# Patient Record
Sex: Male | Born: 1960 | Race: White | Hispanic: No | Marital: Married | Smoking: Never smoker
Health system: Southern US, Community
[De-identification: ages and names within clinical notes are randomized; demographics above are authoritative.]

---

## 1987-12-17 HISTORY — PX: WRIST FRACTURE SURGERY: SHX121

## 2005-11-27 ENCOUNTER — Ambulatory Visit: Payer: Self-pay | Admitting: Family Medicine

## 2006-07-14 ENCOUNTER — Ambulatory Visit: Payer: Self-pay | Admitting: Family Medicine

## 2007-08-18 ENCOUNTER — Encounter: Payer: Self-pay | Admitting: Family Medicine

## 2007-08-19 ENCOUNTER — Ambulatory Visit: Payer: Self-pay | Admitting: Family Medicine

## 2007-08-19 DIAGNOSIS — R42 Dizziness and giddiness: Secondary | ICD-10-CM

## 2007-08-19 LAB — CONVERTED CEMR LAB
Bilirubin Urine: NEGATIVE
Blood in Urine, dipstick: NEGATIVE
Ketones, urine, test strip: NEGATIVE
Protein, U semiquant: NEGATIVE
Urobilinogen, UA: NEGATIVE

## 2007-08-21 ENCOUNTER — Telehealth: Payer: Self-pay | Admitting: Family Medicine

## 2007-09-17 ENCOUNTER — Ambulatory Visit: Payer: Self-pay | Admitting: Family Medicine

## 2007-09-24 ENCOUNTER — Encounter: Payer: Self-pay | Admitting: Family Medicine

## 2007-09-25 ENCOUNTER — Encounter: Payer: Self-pay | Admitting: Family Medicine

## 2007-09-25 LAB — CONVERTED CEMR LAB
Albumin: 4.2 g/dL (ref 3.5–5.2)
Alkaline Phosphatase: 76 units/L (ref 39–117)
BUN: 16 mg/dL (ref 6–23)
Cholesterol: 216 mg/dL — ABNORMAL HIGH (ref 0–200)
Eosinophils Absolute: 0.3 10*3/uL (ref 0.0–0.7)
Eosinophils Relative: 5 % (ref 0–5)
Glucose, Bld: 92 mg/dL (ref 70–99)
HCT: 45.8 % (ref 39.0–52.0)
HDL: 56 mg/dL (ref >39)
Hemoglobin: 15.3 g/dL (ref 13.0–17.0)
LDL Cholesterol: 143 mg/dL — ABNORMAL HIGH (ref 0–99)
Lymphs Abs: 1.7 10*3/uL (ref 0.7–3.3)
MCV: 90 fL (ref 78.0–100.0)
Monocytes Relative: 8 % (ref 3–11)
RBC: 5.09 M/uL (ref 4.22–5.81)
Total Bilirubin: 0.6 mg/dL (ref 0.3–1.2)
Triglycerides: 83 mg/dL (ref <150)
VLDL: 17 mg/dL (ref 0–40)
WBC: 5.8 10*3/uL (ref 4.0–10.5)

## 2008-02-09 ENCOUNTER — Telehealth: Payer: Self-pay | Admitting: Family Medicine

## 2008-10-21 ENCOUNTER — Ambulatory Visit: Payer: Self-pay | Admitting: Family Medicine

## 2008-10-21 DIAGNOSIS — S239XXA Sprain of unspecified parts of thorax, initial encounter: Secondary | ICD-10-CM

## 2008-10-21 DIAGNOSIS — S139XXA Sprain of joints and ligaments of unspecified parts of neck, initial encounter: Secondary | ICD-10-CM

## 2011-01-17 ENCOUNTER — Encounter: Payer: Self-pay | Admitting: Emergency Medicine

## 2011-01-17 ENCOUNTER — Ambulatory Visit: Payer: Self-pay | Admitting: Emergency Medicine

## 2011-01-17 ENCOUNTER — Ambulatory Visit (INDEPENDENT_AMBULATORY_CARE_PROVIDER_SITE_OTHER): Payer: Commercial Managed Care - PPO | Admitting: Emergency Medicine

## 2011-01-17 DIAGNOSIS — L255 Unspecified contact dermatitis due to plants, except food: Secondary | ICD-10-CM | POA: Insufficient documentation

## 2011-01-23 NOTE — Assessment & Plan Note (Signed)
Summary: POISON IVY/TJ   Vital Signs:  Patient profile:   50 year old male Height:      74 inches Weight:      202 pounds BMI:     26.03 O2 Sat:      98 % on Room air Temp:     98.3 degrees F oral Pulse rate:   84 / minute Resp:     16 per minute BP sitting:   117 / 78  (left arm) Cuff size:   large  Vitals Entered By: Lajean Saver RN (January 17, 2011 2:19 PM)  O2 Flow:  Room air History of Present Illness History from: patient Chief Complaint: poison ivy to both hands History of Present Illness: Got into some poison ivy last weekend while doing yard work.  He has tried Rx for Triamcinolone and Atarax which aren't helping much.  Itchy rash to both back of hands.  No other sites are affected.  He is asking for a steroid shot.  No F/C.   CC: poison ivy to both hands Is Patient Diabetic? No Pain Assessment Patient in pain? no        Allergies (verified): No Known Drug Allergies  Past History:  Past Medical History: Reviewed history from 09/23/2006 and no changes required. _  Past Surgical History: Reviewed history from 09/23/2006 and no changes required. Left wrist sx   1989  Family History: Reviewed history from 09/17/2007 and no changes required. DM-GM, GF High chol-Father, brother, HTN-Father,  MI-Father age 63,  Stroke-GF GF died from Brain Tumor, Aunt with Brain Tumor  Social History: Reviewed history from 09/23/2006 and no changes required. Art gallery manager at [employer], Event organiser.  Married to Clydie Braun and 3 children at home.  Never smoked, no EtOH, no drugs, 3-4 caffeinated drinks per week.  Works out 1 hour 3-4 days per week.  Physical Exam  General:  Well-developed,well-nourished,in no acute distress; alert,appropriate and cooperative throughout examination Mouth:  Oral mucosa and oropharynx without lesions or exudates.  Teeth in good repair. Lungs:  Normal respiratory effort, chest expands symmetrically. Lungs are clear to auscultation, no crackles or  wheezes. Heart:  Normal rate and regular rhythm. S1 and S2 normal without gallop, murmur, click, rub or other extra sounds. Skin:  Dorsal aspect both hands and wrists with erythematous irritated raises lesions c/w contact dermatitis. Psych:  Cognition and judgment appear intact. Alert and cooperative with normal attention span and concentration. No apparent delusions, illusions, hallucinations   Impression & Recommendations:  Problem # 1:  POISON IVY DERMATITIS (ICD-692.6) Continue Atarax and Triamcinolone cream as needed.  Avoid scratching.  Cooler showers.  Take meds as directed.  Follow-up with your primary care physician if not improving or if getting worse.  May take a few more days to start getting better.  His updated medication list for this problem includes:    Prednisone 10 Mg Tabs (Prednisone) ..... 20mg  two times a day for 4 days, no taper  Orders: Solumedrol up to 125mg  (W1191) Admin of Therapeutic Inj  intramuscular or subcutaneous (47829)  Complete Medication List: 1)  Calcium 500 + D 500-125 Mg-unit Tabs (Calcium carbonate-vitamin d) .... Take one tablet by mouth once a day 2)  Multivitamins Tabs (Multiple vitamin) .... Take one tablet by mouth once aday 3)  Niacin 500 Mg Tabs (Niacin) .... Take one tablet by mouth once aday 4)  Co Q-10 Vitamin E Fish Oil 60-90-25-200 Caps (Dha-epa-coenzyme q10-vitamin e) 5)  Tamiflu 75 Mg Caps (Oseltamivir phosphate) .Marland KitchenMarland KitchenMarland Kitchen  Take 1 tablet by mouth once a day for 10 days 6)  Diclofenac Sodium 75 Mg Tbec (Diclofenac sodium) .... Take 1 tablet by mouth two times a day for muscle pain 7)  Flexeril 10 Mg Tabs (Cyclobenzaprine hcl) .... Take 1 tablet by mouth once a day at bedtime as needed muscle spasm 8)  Skelaxin 800 Mg Tabs (Metaxalone) .... Take 1 tablet by mouth three times a day as needed muscle spasm 9)  Prednisone 10 Mg Tabs (Prednisone) .... 20mg  two times a day for 4 days, no taper Prescriptions: PREDNISONE 10 MG TABS (PREDNISONE) 20mg   two times a day for 4 days, no taper  #QS x 0   Entered and Authorized by:   Hoyt Koch MD   Signed by:   Hoyt Koch MD on 01/17/2011   Method used:   Print then Give to Patient   RxID:   [id number]    Medication Administration  Injection # 1:    Medication: Solumedrol up to 125mg     Diagnosis: POISON IVY DERMATITIS (ICD-692.6)    Route: IM    Site: LUOQ gluteus    Exp Date: 05/17/2013    Lot #: [id number]    Mfr: Pharmacia    Patient tolerated injection without complications    Given by: Clemens Catholic LPN (January 17, 2011 2:40 PM)  Orders Added: 1)  New Patient Level III [99203] 2)  Solumedrol up to 125mg  [J2930] 3)  Admin of Therapeutic Inj  intramuscular or subcutaneous [14782]

## 2011-02-27 ENCOUNTER — Ambulatory Visit
Admission: RE | Admit: 2011-02-27 | Discharge: 2011-02-27 | Disposition: A | Discharge status: Home / Self Care | Payer: Commercial Managed Care - PPO | Source: Ambulatory Visit | Attending: Family Medicine | Admitting: Family Medicine

## 2011-02-27 ENCOUNTER — Ambulatory Visit (INDEPENDENT_AMBULATORY_CARE_PROVIDER_SITE_OTHER): Payer: BC Managed Care – PPO | Admitting: Family Medicine

## 2011-02-27 ENCOUNTER — Other Ambulatory Visit: Payer: Self-pay | Admitting: Family Medicine

## 2011-02-27 ENCOUNTER — Encounter: Payer: Self-pay | Admitting: Family Medicine

## 2011-02-27 DIAGNOSIS — N508 Other specified disorders of male genital organs: Secondary | ICD-10-CM | POA: Insufficient documentation

## 2011-02-27 DIAGNOSIS — N5089 Other specified disorders of the male genital organs: Secondary | ICD-10-CM

## 2011-02-27 LAB — CONVERTED CEMR LAB
Bilirubin Urine: NEGATIVE
Glucose, Urine, Semiquant: NEGATIVE
Ketones, urine, test strip: NEGATIVE
WBC Urine, dipstick: NEGATIVE
pH: 5.5

## 2011-02-28 ENCOUNTER — Encounter: Payer: Self-pay | Admitting: Family Medicine

## 2011-03-05 NOTE — Assessment & Plan Note (Signed)
Summary: Followup Call  Followup call to Patient:  He reports that his discomfort has decreased.  Has not been taking NSAID.  No dysuria. Recommended that he continue Ibuprofen regularly for several days. Recommend urology evaluation if not improving. Donna Christen MD  February 28, 2011 1:32 PM

## 2011-03-05 NOTE — Assessment & Plan Note (Signed)
Summary: KNOT ON TESTICLE ? (rm 5)   Vital Signs:  Patient Profile:   50 Years Old Male CC:      knot on testicle, discomfort Height:     74 inches (157.48 cm) Weight:      200 pounds O2 Sat:      98 % O2 treatment:    Room Air Temp:     98.8 degrees F oral Pulse rate:   72 / minute Resp:     16 per minute BP sitting:   132 / 88  (left arm) Cuff size:   large  Vitals Entered By: Lajean Saver RN (February 27, 2011 8:28 AM)                  Updated Prior Medication List: CALCIUM 500 + D 500-125 MG-UNIT  TABS (CALCIUM CARBONATE-VITAMIN D) Take one tablet by mouth once a day MULTIVITAMINS   TABS (MULTIPLE VITAMIN) Take one tablet by mouth once aday NIACIN 500 MG  TABS (NIACIN) Take one tablet by mouth once aday CO Q-10 VITAMIN E FISH OIL 60-90-25-200  CAPS (DHA-EPA-COENZYME Q10-VITAMIN E)   Current Allergies: No known allergies History of Present Illness Chief Complaint: knot on testicle, discomfort History of Present Illness:  Subjective:  Patient complains of noticing some vague discomfort in his right scrotum about a week ago.  This morning he noticed discomfort again, and upon palpating his right scrotum he found a tender swollen area in right upper scrotum.  No dysuria.  No fevers, chills, and sweat.  No abdominal pain.  He feels well otherwise. He had an episode of right testicular pain about 4 years ago and was ultimately treated for epididymitis by a urologist with resolution of symptoms   REVIEW OF SYSTEMS Constitutional Symptoms      Denies fever, chills, night sweats, weight loss, weight gain, and fatigue.  Eyes       Denies change in vision, eye pain, eye discharge, glasses, contact lenses, and eye surgery. Ear/Nose/Throat/Mouth       Denies hearing loss/aids, change in hearing, ear pain, ear discharge, dizziness, frequent runny nose, frequent nose bleeds, sinus problems, sore throat, hoarseness, and tooth pain or bleeding.  Respiratory       Denies dry cough,  productive cough, wheezing, shortness of breath, asthma, bronchitis, and emphysema/COPD.  Cardiovascular       Denies murmurs, chest pain, and tires easily with exhertion.    Gastrointestinal       Denies stomach pain, nausea/vomiting, diarrhea, constipation, blood in bowel movements, and indigestion. Genitourniary       Denies painful urination, kidney stones, and loss of urinary control. Neurological       Denies paralysis, seizures, and fainting/blackouts. Musculoskeletal       Denies muscle pain, joint pain, joint stiffness, decreased range of motion, redness, swelling, muscle weakness, and gout.  Skin       Denies bruising, unusual mles/lumps or sores, and hair/skin or nail changes.  Psych       Denies mood changes, temper/anger issues, anxiety/stress, speech problems, depression, and sleep problems. Other Comments: Patient has had some testicular discomfort, mild, x 1 week. This AM he noticed a knot underneath the skin about the size of an M&M. Denies any urianry changes. No hx of prostate problems   Past History:  Past Medical History: _ Unremarkable  Past Surgical History: Reviewed history from 09/23/2006 and no changes required. Left wrist sx   1989  Family History: Reviewed history from 09/17/2007  and no changes required. DM-GM, GF High chol-Father, brother, HTN-Father,  MI-Father age 72,  Stroke-GF GF died from Brain Tumor, Aunt with Brain Tumor  Social History: Reviewed history from 09/23/2006 and no changes required. Art gallery manager at [employer], Event organiser.  Married to Clydie Braun and 3 children at home.  Never smoked, no EtOH, no drugs, 3-4 caffeinated drinks per week.  Works out 1 hour 3-4 days per week.   Objective:  Appearance:  Patient appears healthy, stated age, and in no acute distress  Abdomen:  Nontender without masses or hepatosplenomegaly.  Bowel sounds are present.  No CVA or flank tenderness.  Genitourinary:  Penis normal without lesions or urethral  discharge.  Scrotum is normal without erythema or warmth.  Testes are descended bilaterally.  Left testicle and epididymitis normal without masses.  On superior/anterior pole of right testicle is a cystic mass that is tender to palpation.  Unable to adequately palpate right testicle because of patient's discomfort.  No hernias are palpated.  No regional lymphadenopathy palpated.  urinalysis (dipstick): negative  Ultrasound Scrotum:   IMPRESSION:  1.  The testicles are normal. 2.  There is a 4.3 mm cyst in the right epididymal head that seems to correlate clinically with the area of concern on physical exam. 3.  There is also a left epididymal cyst. 4.  There are bilateral hydroceles. 5.  There are bilateral varicoceles. Assessment New Problems: EPIDIDYMAL CYST (ICD-608.89)   Plan New Orders: Ultrasound [Ultrasound] T-Urinalysis Dipstick only [81003QW] Est. Patient Level III [04540] Planning Comments:   Later:  Discussed ultrasound results with patient.  He notes that his discomfort is minimal after taking Ibuprofen. Recommend that he continue Ibuprofen 200mg , 4 tabs every 8 hours with food for several days until improved. Recommend follow-up by urologist (may call for referral)   The patient and/or caregiver has been counseled thoroughly with regard to medications prescribed including dosage, schedule, interactions, rationale for use, and possible side effects and they verbalize understanding.  Diagnoses and expected course of recovery discussed and will return if not improved as expected or if the condition worsens. Patient and/or caregiver verbalized understanding.   Orders Added: 1)  Ultrasound [Ultrasound] 2)  T-Urinalysis Dipstick only [81003QW] 3)  Est. Patient Level III [98119]    Laboratory Results   Urine Tests  Date/Time Received: February 27, 2011 8:47 AM  Date/Time Reported: February 27, 2011 8:47 AM   Routine Urinalysis   Color: yellow Appearance:  Clear Glucose: negative   (Normal Range: Negative) Bilirubin: negative   (Normal Range: Negative) Ketone: negative   (Normal Range: Negative) Spec. Gravity: 1.025   (Normal Range: 1.003-1.035) Blood: negative   (Normal Range: Negative) pH: 5.5   (Normal Range: 5.0-8.0) Protein: negative   (Normal Range: Negative) Urobilinogen: 0.2   (Normal Range: 0-1) Nitrite: negative   (Normal Range: Negative) Leukocyte Esterace: negative   (Normal Range: Negative)

## 2011-05-23 ENCOUNTER — Encounter: Payer: Self-pay | Admitting: Family Medicine

## 2011-05-24 ENCOUNTER — Ambulatory Visit (INDEPENDENT_AMBULATORY_CARE_PROVIDER_SITE_OTHER): Payer: BC Managed Care – PPO | Admitting: Family Medicine

## 2011-05-24 ENCOUNTER — Encounter: Payer: Self-pay | Admitting: Family Medicine

## 2011-05-24 VITALS — BP 127/74 | HR 74 | Resp 20 | Ht 72.0 in | Wt 209.0 lb

## 2011-05-24 DIAGNOSIS — Z23 Encounter for immunization: Secondary | ICD-10-CM

## 2011-05-24 DIAGNOSIS — Z0000 Encounter for general adult medical examination without abnormal findings: Secondary | ICD-10-CM

## 2011-05-24 MED ORDER — TETANUS-DIPHTH-ACELL PERTUSSIS 5-2.5-18.5 LF-MCG/0.5 IM SUSP: 0.5000 mL | Freq: Once | INTRAMUSCULAR | Status: DC

## 2011-05-24 NOTE — Progress Notes (Signed)
Subjective:    Patient ID: Jimmy Hoffman, male    DOB: 1961/04/20, 50 y.o.   MRN: [id number]  HPI Here for CPE today.  REviewed his history as below and information updated. He has nos specific complaints today.  Works out some.     Review of Systems  BP 127/74  Pulse 74  Resp 20  Ht 6' (1.829 m)  Wt 209 lb (94.802 kg)  BMI 28.35 kg/m2  SpO2 98%    No Known Allergies  No past medical history on file.  Past Surgical History  Procedure Date  . Wrist fracture surgery 1989    Left     History   Social History  . Marital Status: Married    Spouse Name: N/A    Number of Children: 3  . Years of Education: Masters   Occupational History  . 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    Social History Main Topics  . Smoking status: Never Smoker   . Smokeless tobacco: Not on file  . Alcohol Use: No  . Drug Use: No  . Sexually Active: Not on file     , 3 children,3-4 caffeine drinks per week, works out 3-4 fays per week.   Other Topics Concern  . Not on file   Social History Narrative   Working out regularly.  Occ caffeine.      Family History  Problem Relation Age of Onset  . Diabetes Maternal Grandmother   . Hyperlipidemia Father   . Hypertension Father   . Hyperlipidemia Brother   . Heart attack Father 48  . Stroke Maternal Grandfather   . Brain cancer Paternal Grandfather     Brain tumor  . Brain cancer Paternal Aunt     brain tumor  . Diabetes Maternal Grandfather     Current outpatient prescriptions:Calcium Carbonate-Vitamin D (CALCIUM + D PO), Take 125 mg by mouth daily.  , Disp: , Rfl: ;  Coenzyme Q10 200 MG capsule, Take by mouth daily. Take one tablet by mouth once daily of the COQ10 vitamin E fish oil 60-90-25-200. , Disp: , Rfl: ;  Multiple Vitamin (MULTIVITAMIN) tablet, Take 1 tablet by mouth daily.  , Disp: , Rfl: ;  niacin (NIASPAN) 500 MG CR tablet, Take 500 mg by mouth at bedtime.  , Disp: , Rfl:     Objective:   Physical Exam  Constitutional:  He is oriented to person, place, and time. He appears well-developed and well-nourished.  HENT:  Head: Normocephalic and atraumatic.  Right Ear: External ear normal.  Left Ear: External ear normal.  Nose: Nose normal.  Mouth/Throat: Oropharynx is clear and moist.  Eyes: Conjunctivae and EOM are normal. Pupils are equal, round, and reactive to light.  Neck: Normal range of motion. Neck supple. No thyromegaly present.  Cardiovascular: Normal rate, regular rhythm, normal heart sounds and intact distal pulses.   Pulmonary/Chest: Effort normal and breath sounds normal.  Abdominal: Soft. Bowel sounds are normal. He exhibits no distension and no mass. There is no tenderness. There is no rebound and no guarding.  Musculoskeletal: Normal range of motion.  Lymphadenopathy:    He has no cervical adenopathy.  Neurological: He is alert and oriented to person, place, and time. He has normal reflexes.  Skin: Skin is warm and dry.  Psychiatric: He has a normal mood and affect. His behavior is normal. Judgment and thought content normal.          Assessment & Plan:  CPE -  Exam is normal today.  REally needs Tdap and screening labs.  Encouraged regular exericse adn healthy diet.

## 2011-05-24 NOTE — Patient Instructions (Signed)
Keep up the regular exercise and diet Try to go to the lab next week, fast for 8 hours before you go. You can have water.  Wear sunscreen over the summer.

## 2011-05-27 ENCOUNTER — Telehealth: Payer: Self-pay | Admitting: Family Medicine

## 2011-05-27 NOTE — Telephone Encounter (Signed)
Closed

## 2011-05-29 ENCOUNTER — Telehealth: Payer: Self-pay | Admitting: Family Medicine

## 2011-05-31 NOTE — Telephone Encounter (Signed)
CLosed

## 2011-06-17 MED ORDER — TETANUS-DIPHTH-ACELL PERTUSSIS 5-2.5-18.5 LF-MCG/0.5 IM SUSP: 0.5000 mL | Freq: Once | INTRAMUSCULAR | Status: DC

## 2012-03-31 IMAGING — US US SCROTUM
1 series · 14 of 25 positions shown · non-contrast
Comparison: None.

CLINICAL DATA: Cystic mass in the upper aspect of the right scrotal
sac

ULTRASOUND OF SCROTUM
TECHNIQUE: Complete ultrasound examination of the testicles,
epididymis, and other scrotal structures was performed.

[Series 1: us scrotum · 0.08mm/px · 14 of 52 slices shown]
[im 1/52]
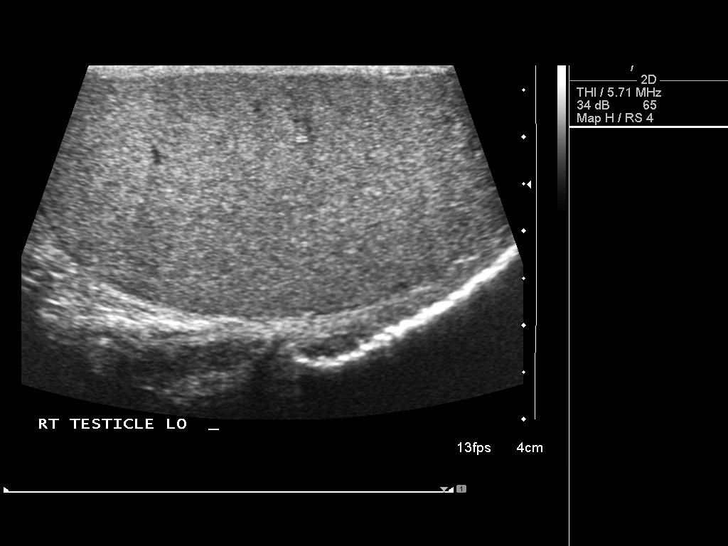
[im 5/52]
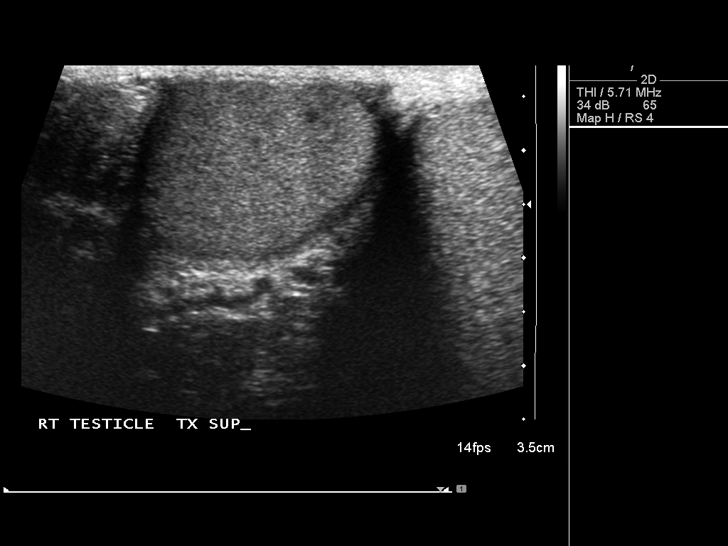
[im 9/52]
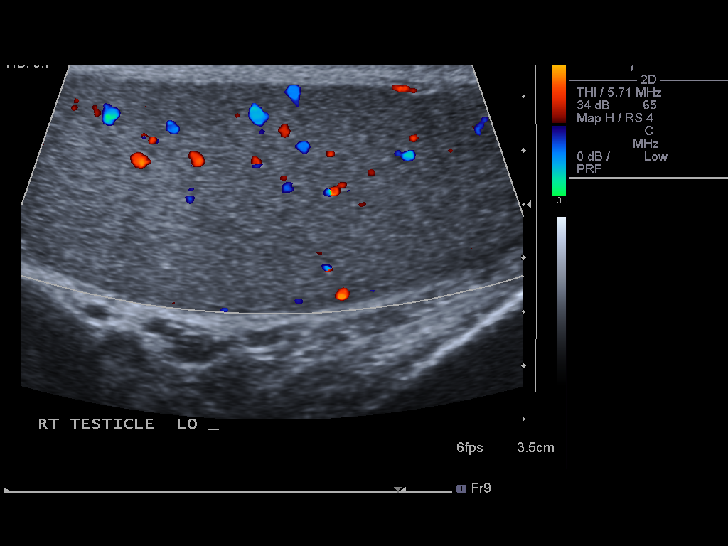
[im 13/52]
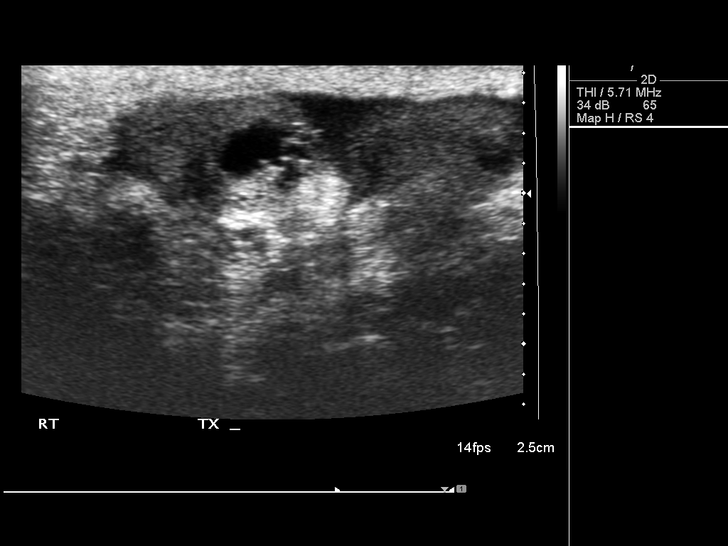
[im 18/52]
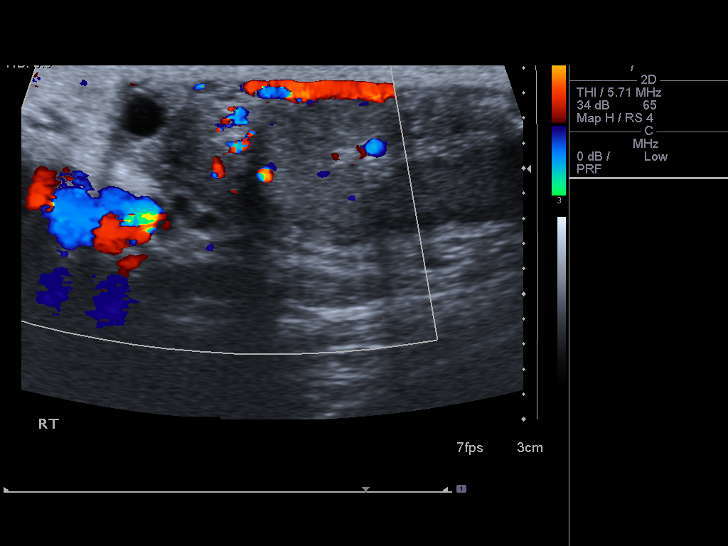
[im 20/52]
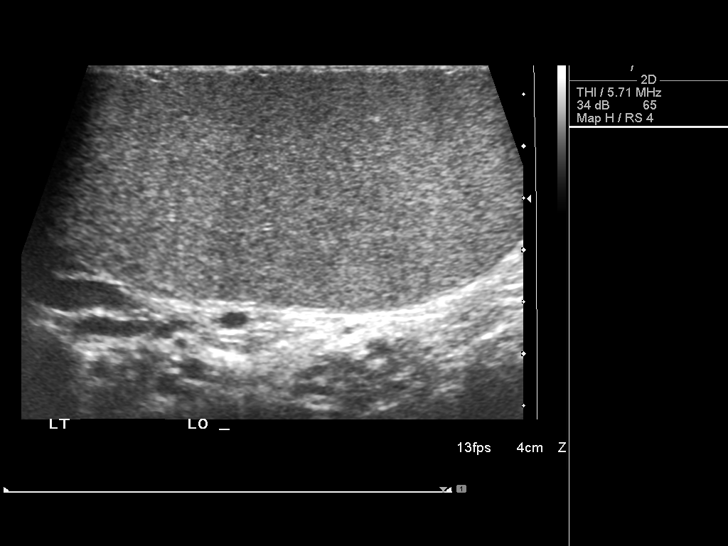
[im 24/52]
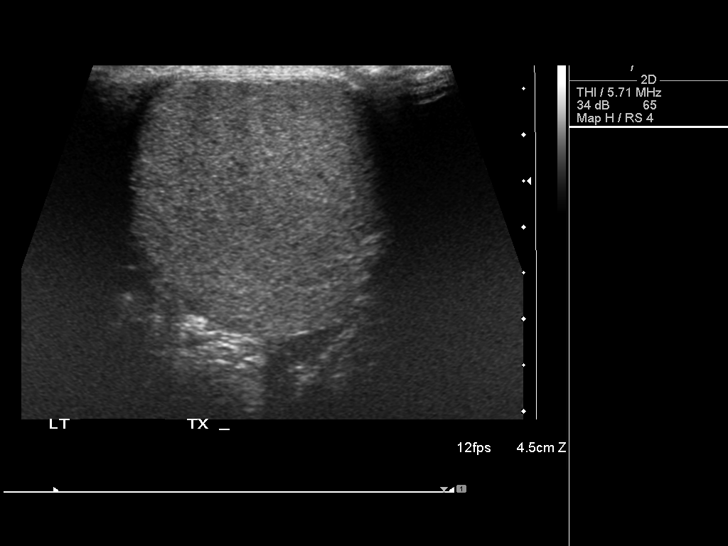
[im 28/52]
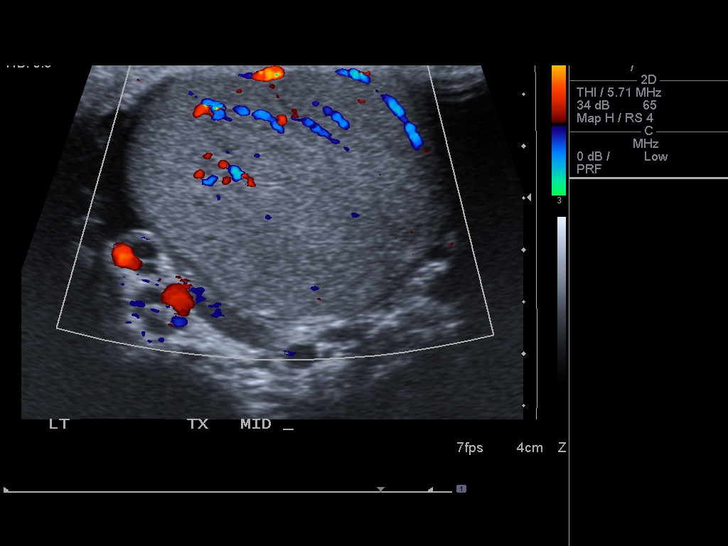
[im 32/52]
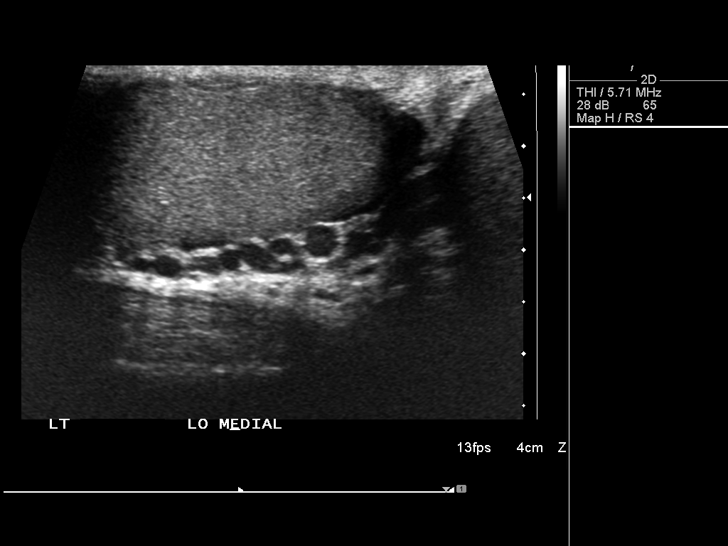
[im 35/52]
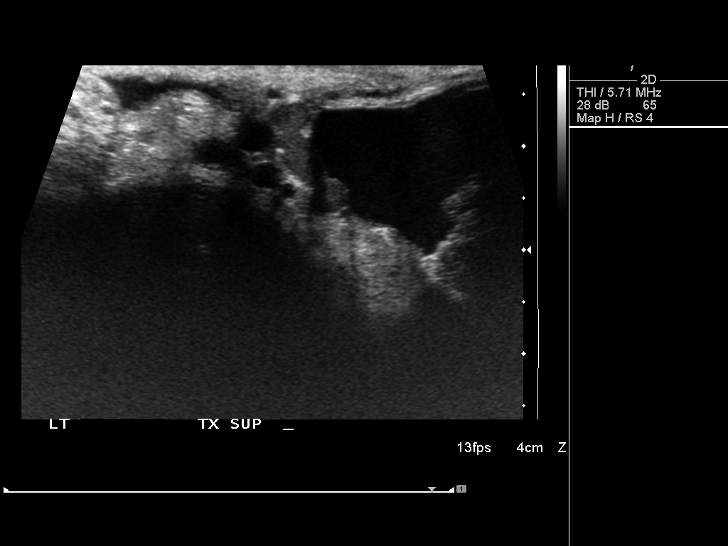
[im 39/52]
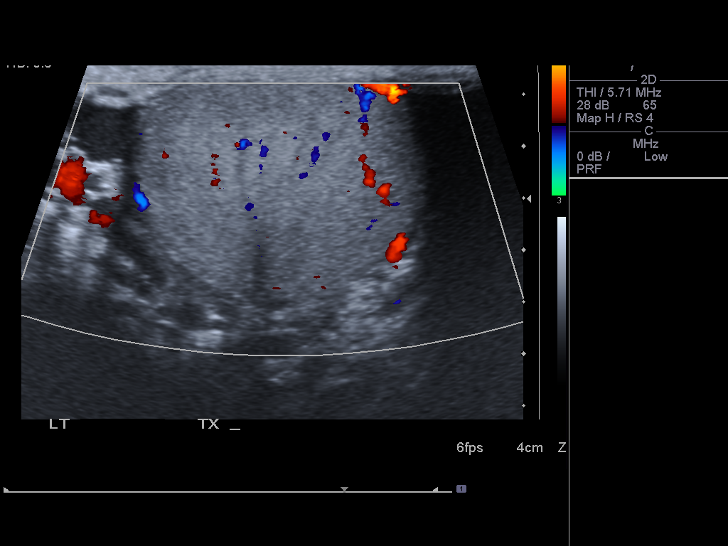
[im 43/52]
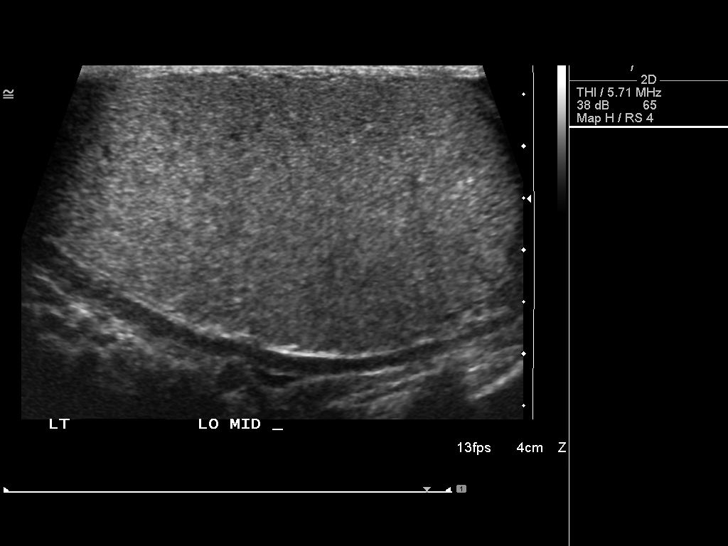
[im 47/52]
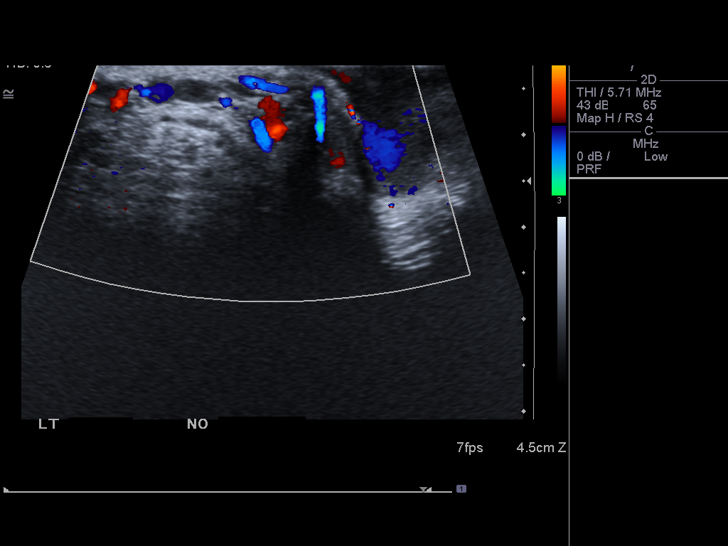
[im 52/52]
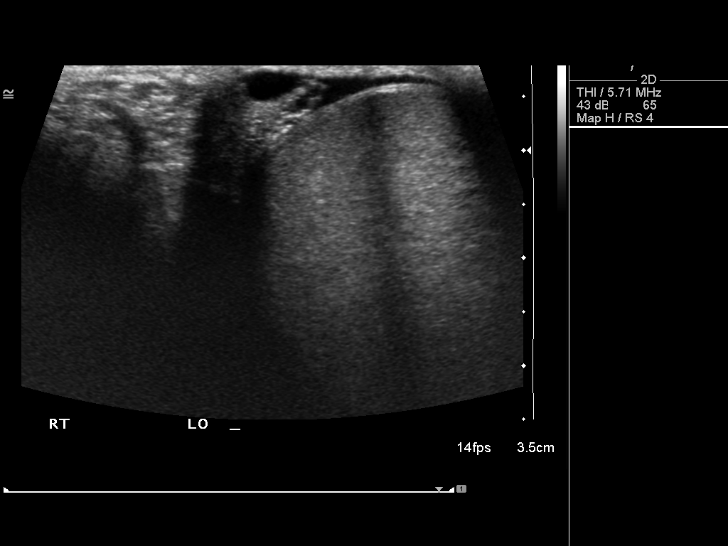

[14 of 25 positions shown; findings below may reference images not displayed]

FINDINGS: Right testis:  5.0 x 2.6 x 3.5 cm.  No focal lesions.  Normal color
flow.

Left testis:  5.1 x 2.9 x 3.0 cm.  No focal lesions.  Normal flow.

Right epididymis:  There is a 4.3 mm epididymal cyst that
correlates clinically with the area of concern.

Left epididymis:  There is a 7 mm left epididymal cyst.

Hydrocele:  Present bilaterally

Varicocele:  Present bilaterally
IMPRESSION: 1.  The testicles are normal.
2.  There is a 4.3 mm cyst in the right epididymal head that seems
to correlate clinically with the area of concern on physical exam.
3.  There is also a left epididymal cyst.
4.  There are bilateral hydroceles.
5.  There are bilateral varicoceles.

## 2013-10-12 ENCOUNTER — Ambulatory Visit (INDEPENDENT_AMBULATORY_CARE_PROVIDER_SITE_OTHER): Payer: Self-pay | Admitting: Surgery

## 2014-08-08 ENCOUNTER — Encounter: Payer: BC Managed Care – PPO | Admitting: Family Medicine

## 2014-08-26 ENCOUNTER — Ambulatory Visit (INDEPENDENT_AMBULATORY_CARE_PROVIDER_SITE_OTHER): Payer: PRIVATE HEALTH INSURANCE | Admitting: Family Medicine

## 2014-08-26 ENCOUNTER — Encounter: Payer: Self-pay | Admitting: Family Medicine

## 2014-08-26 VITALS — BP 135/75 | HR 85 | Ht 72.0 in | Wt 205.0 lb

## 2014-08-26 DIAGNOSIS — Z0000 Encounter for general adult medical examination without abnormal findings: Secondary | ICD-10-CM

## 2014-08-26 DIAGNOSIS — Z1211 Encounter for screening for malignant neoplasm of colon: Secondary | ICD-10-CM

## 2014-08-26 NOTE — Patient Instructions (Signed)
Keep up a regular exercise program and make sure you are eating a healthy diet Try to eat 4 servings of dairy a day, or if you are lactose intolerant take a calcium with vitamin D daily.  Your vaccines are up to date.   

## 2014-08-26 NOTE — Progress Notes (Signed)
   Subjective:    Patient ID: Dash Cardarelli, male    DOB: Jul 09, 1961, 53 y.o.   MRN: [id number]  HPI Here for for CPE.  No specific complaints.  He is here for a complete physical today. He has not been seen in about 3 years or had blood work in the interim. He's no longer taking any prescription medications. He denies taking any over-the-counter supplements. He says he does exercise regularly. He does use sunscreen.  Review of Systems Comprehensive review of systems is negative. No chest pain, shortness of breath, hearing or vision changes, abdominal pain. No blood in the urine or stool.    Objective:   Physical Exam  Constitutional: He is oriented to person, place, and time. He appears well-developed and well-nourished.  HENT:  Head: Normocephalic and atraumatic.  Right Ear: External ear normal.  Left Ear: External ear normal.  Nose: Nose normal.  Mouth/Throat: Oropharynx is clear and moist.  Eyes: Conjunctivae and EOM are normal. Pupils are equal, round, and reactive to light.  Neck: Normal range of motion. Neck supple. No thyromegaly present.  Cardiovascular: Normal rate, regular rhythm, normal heart sounds and intact distal pulses.   Pulmonary/Chest: Effort normal and breath sounds normal.  Abdominal: Soft. Bowel sounds are normal. He exhibits no distension and no mass. There is no tenderness. There is no rebound and no guarding.  Musculoskeletal: Normal range of motion.  Lymphadenopathy:    He has no cervical adenopathy.  Neurological: He is alert and oriented to person, place, and time. He has normal reflexes.  Skin: Skin is warm and dry.  Psychiatric: He has a normal mood and affect. His behavior is normal. Judgment and thought content normal.          Assessment & Plan:  Keep up a regular exercise program and make sure you are eating a healthy diet Try to eat 4 servings of dairy a day, or if you are lactose intolerant take a calcium with vitamin D daily.  Your  vaccines are up to date.  Discussed the pros and cons of prostate for screening. He opted to do a PSA.  Due for colonoscopy. Discussed need for colon cancer screening. He is okay with scheduling. We'll make referral to digestive health specialist per patient preference.  Declined flu vaccine today.  Tdap up-to-date.

## 2014-09-02 LAB — COMPLETE METABOLIC PANEL WITH GFR
ALBUMIN: 4.3 g/dL (ref 3.5–5.2)
ALK PHOS: 68 U/L (ref 39–117)
ALT: 18 U/L (ref 0–53)
AST: 21 U/L (ref 0–37)
BILIRUBIN TOTAL: 0.7 mg/dL (ref 0.2–1.2)
BUN: 17 mg/dL (ref 6–23)
CO2: 27 mEq/L (ref 19–32)
Calcium: 9.4 mg/dL (ref 8.4–10.5)
Chloride: 105 mEq/L (ref 96–112)
Creat: 1.05 mg/dL (ref 0.50–1.35)
GFR, EST NON AFRICAN AMERICAN: 82 mL/min
GFR, Est African American: >89 mL/min
GLUCOSE: 78 mg/dL (ref 70–99)
POTASSIUM: 4.5 meq/L (ref 3.5–5.3)
SODIUM: 140 meq/L (ref 135–145)
TOTAL PROTEIN: 6.9 g/dL (ref 6.0–8.3)

## 2014-09-02 LAB — LIPID PANEL
CHOL/HDL RATIO: 4 ratio
Cholesterol: 220 mg/dL — ABNORMAL HIGH (ref 0–200)
HDL: 55 mg/dL (ref >39)
LDL Cholesterol: 146 mg/dL — ABNORMAL HIGH (ref 0–99)
Triglycerides: 95 mg/dL (ref <150)
VLDL: 19 mg/dL (ref 0–40)

## 2014-09-03 LAB — PSA: PSA: 0.73 ng/mL (ref <=4.00)

## 2014-10-24 LAB — HM COLONOSCOPY

## 2014-10-29 ENCOUNTER — Encounter: Payer: Self-pay | Admitting: Family Medicine
